# Patient Record
Sex: Female | Born: 1985 | Race: Black or African American | Hispanic: No | Marital: Single | Smoking: Never smoker
Health system: Southern US, Community
[De-identification: ages and names within clinical notes are randomized; demographics above are authoritative.]

## PROBLEM LIST (undated history)

## (undated) DIAGNOSIS — N841 Polyp of cervix uteri: Secondary | ICD-10-CM

## (undated) DIAGNOSIS — K219 Gastro-esophageal reflux disease without esophagitis: Secondary | ICD-10-CM

## (undated) HISTORY — DX: Gastro-esophageal reflux disease without esophagitis: K21.9

## (undated) HISTORY — PX: CHOLECYSTECTOMY: SHX55

## (undated) HISTORY — DX: Polyp of cervix uteri: N84.1

---

## 2005-12-28 ENCOUNTER — Emergency Department (HOSPITAL_COMMUNITY): Admission: EM | Admit: 2005-12-28 | Discharge: 2005-12-28 | Payer: Self-pay | Admitting: Emergency Medicine

## 2006-12-30 ENCOUNTER — Inpatient Hospital Stay (HOSPITAL_COMMUNITY): Admission: AD | Admit: 2006-12-30 | Discharge: 2006-12-30 | Payer: Self-pay | Admitting: Obstetrics & Gynecology

## 2007-01-03 ENCOUNTER — Inpatient Hospital Stay (HOSPITAL_COMMUNITY): Admission: AD | Admit: 2007-01-03 | Discharge: 2007-01-03 | Payer: Self-pay | Admitting: Obstetrics & Gynecology

## 2007-01-06 ENCOUNTER — Inpatient Hospital Stay (HOSPITAL_COMMUNITY): Admission: AD | Admit: 2007-01-06 | Discharge: 2007-01-06 | Payer: Self-pay | Admitting: Obstetrics and Gynecology

## 2007-01-13 ENCOUNTER — Inpatient Hospital Stay (HOSPITAL_COMMUNITY): Admission: RE | Admit: 2007-01-13 | Discharge: 2007-01-13 | Payer: Self-pay | Admitting: Gynecology

## 2007-01-20 ENCOUNTER — Inpatient Hospital Stay (HOSPITAL_COMMUNITY): Admission: AD | Admit: 2007-01-20 | Discharge: 2007-01-20 | Payer: Self-pay | Admitting: Obstetrics and Gynecology

## 2007-01-28 ENCOUNTER — Inpatient Hospital Stay (HOSPITAL_COMMUNITY): Admission: AD | Admit: 2007-01-28 | Discharge: 2007-01-28 | Payer: Self-pay | Admitting: Obstetrics and Gynecology

## 2007-02-04 ENCOUNTER — Inpatient Hospital Stay (HOSPITAL_COMMUNITY): Admission: RE | Admit: 2007-02-04 | Discharge: 2007-02-04 | Payer: Self-pay | Admitting: Obstetrics & Gynecology

## 2007-06-24 ENCOUNTER — Emergency Department: Payer: Self-pay | Admitting: Emergency Medicine

## 2008-08-23 ENCOUNTER — Emergency Department (HOSPITAL_COMMUNITY): Admission: EM | Admit: 2008-08-23 | Discharge: 2008-08-24 | Payer: Self-pay | Admitting: Emergency Medicine

## 2008-10-25 ENCOUNTER — Inpatient Hospital Stay (HOSPITAL_COMMUNITY): Admission: AD | Admit: 2008-10-25 | Discharge: 2008-10-25 | Payer: Self-pay | Admitting: Obstetrics & Gynecology

## 2009-03-13 IMAGING — US US OB TRANSVAGINAL MODIFY
1 series · 14 of 28 positions shown · non-contrast
Comparison: None.

CLINICAL DATA: Pelvic and pain and vaginal bleeding. Four weeks and 4 days
pregnant by last menstrual period. Quantitative beta-HCG pending.

COMPLETE OBSTETRICAL ULTRASOUND LESS THAN 14 WEEKS AND TRANSVAGINAL OBSTETRICAL
ULTRASOUND

[Series 1: us ob comp less 14 wks · 14 of 43 slices shown]
[im 2/43]
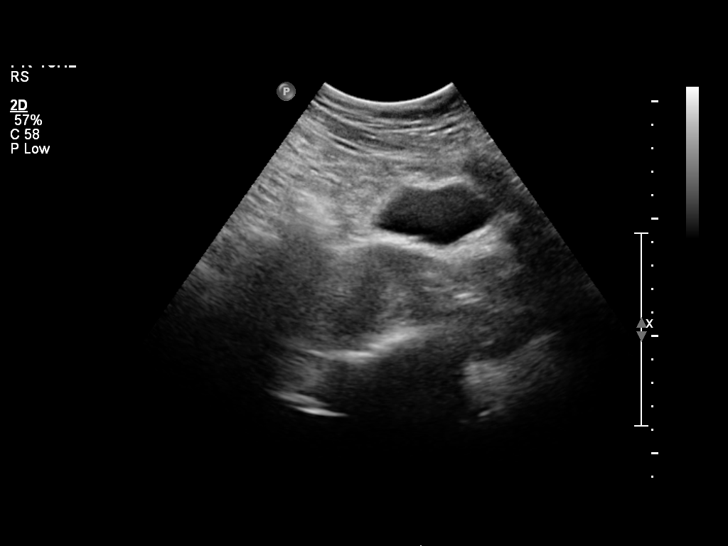
[im 5/43]
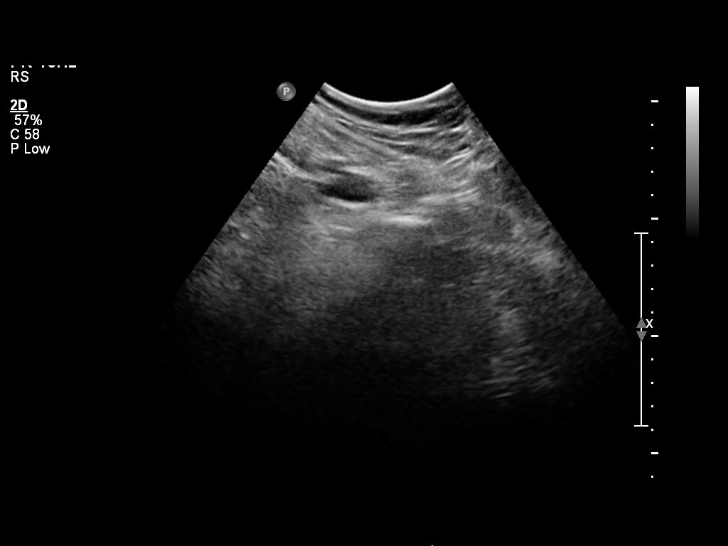
[im 8/43]
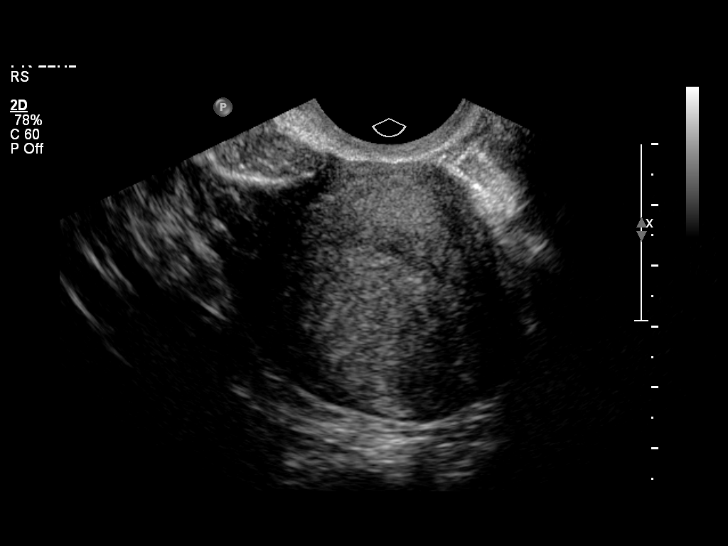
[im 11/43]
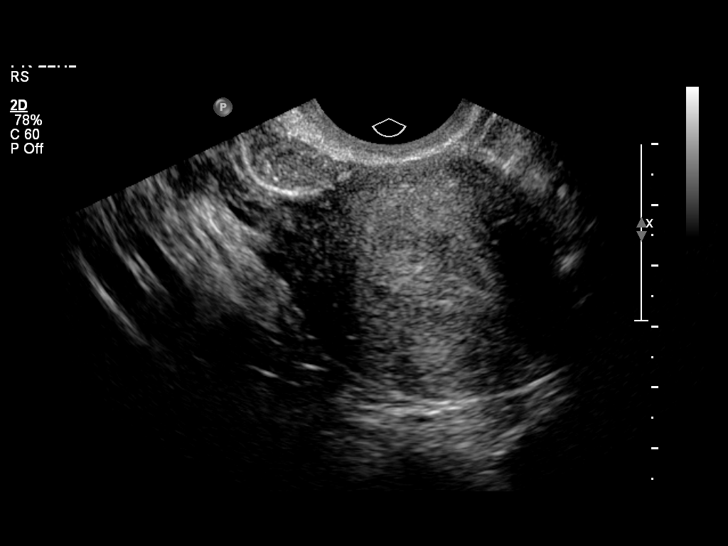
[im 15/43]
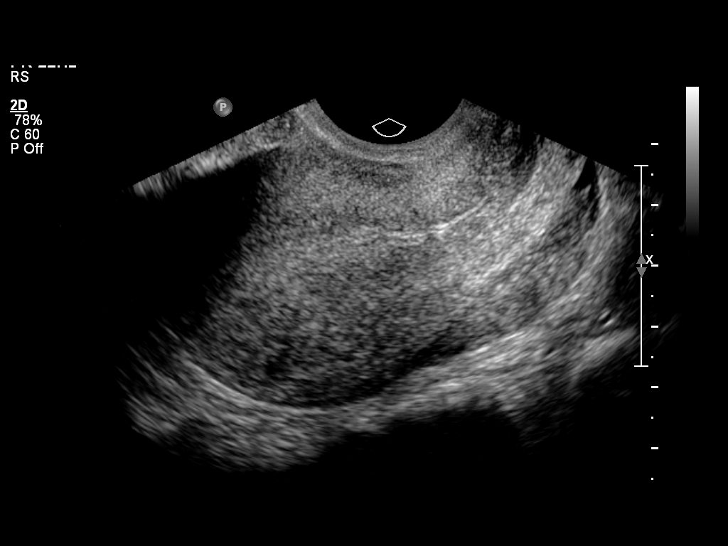
[im 18/43]
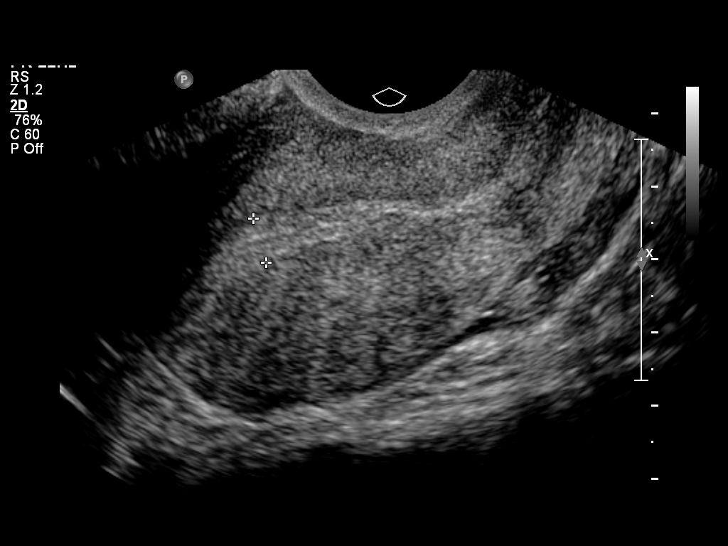
[im 21/43]
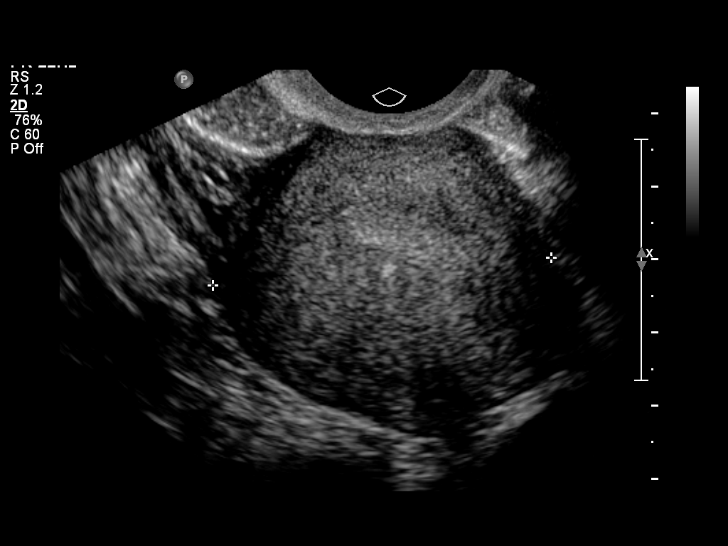
[im 24/43]
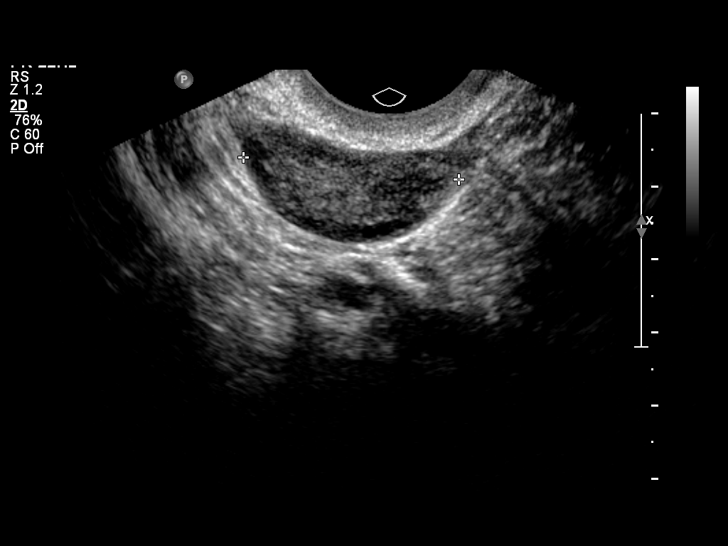
[im 27/43]
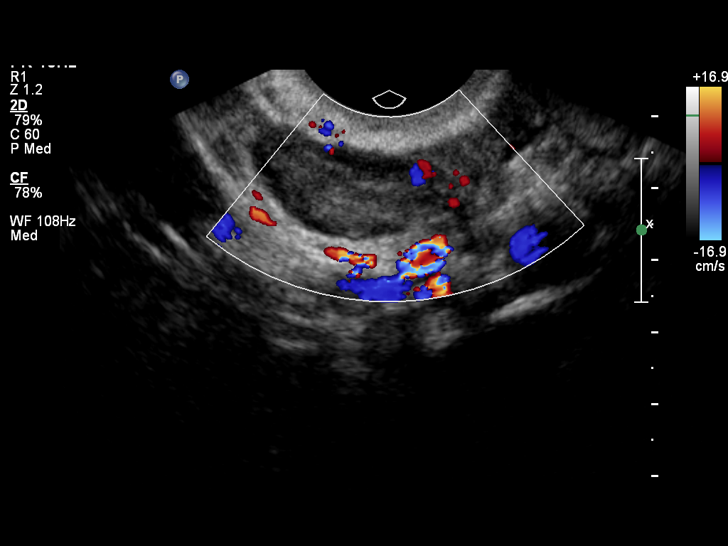
[im 30/43]
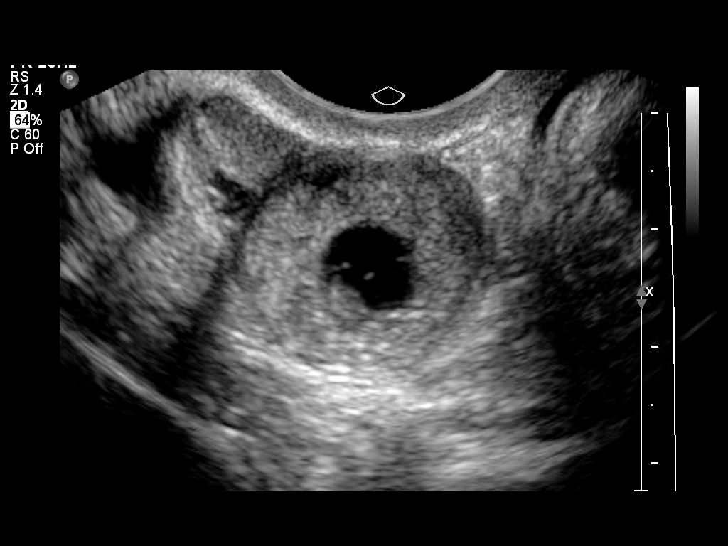
[im 33/43]
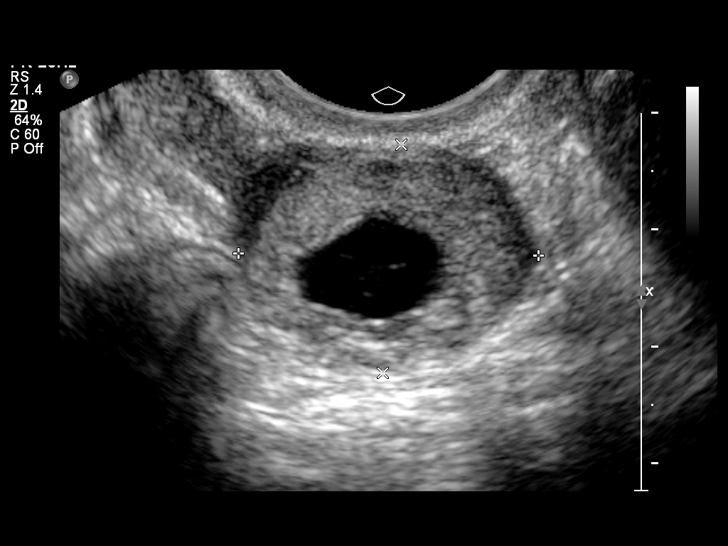
[im 36/43]
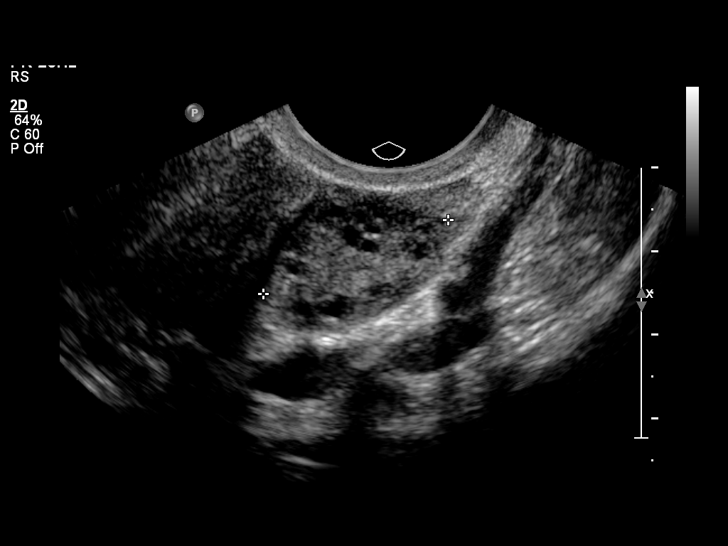
[im 39/43]
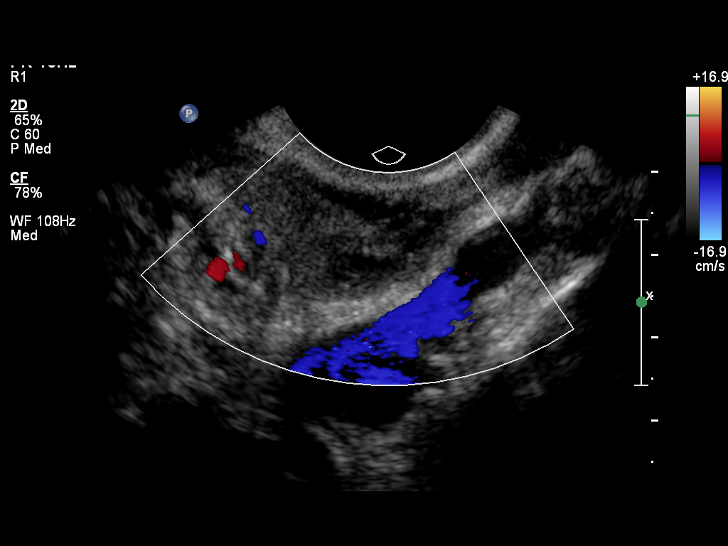
[im 43/43]
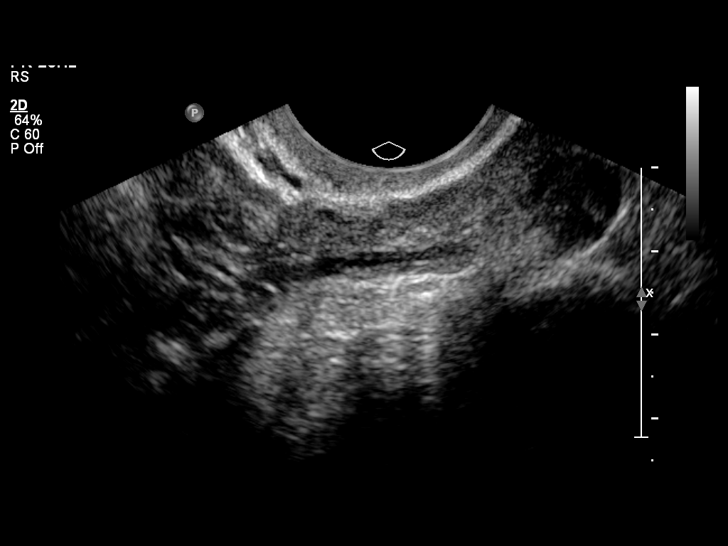

[14 of 28 positions shown; findings below may reference images not displayed]

FINDINGS: Transabdominal and transvaginal sonographic imaging of the pelvis
demonstrates a normal appearing uterus with a normal appearing endometrial
stripe, measuring 6.3 mm in maximum thickness transvaginally. 2.6 x 2.1 x 2.0 cm
right adnexal mass with an appearance compatible with an ectopic pregnancy.
Within the gestational sac, thin linear membranes are demonstrated with no
visible fetal pole or yolk sac. Normal appearing ovaries. No free peritoneal
fluid.

IMPRESSION

Right ectopic pregnancy without rupture or visible fetal pole, most likely
representing fetal demise.

## 2009-04-28 ENCOUNTER — Inpatient Hospital Stay (HOSPITAL_COMMUNITY): Admission: AD | Admit: 2009-04-28 | Discharge: 2009-04-28 | Payer: Self-pay | Admitting: Obstetrics and Gynecology

## 2010-05-14 LAB — POCT PREGNANCY, URINE: Preg Test, Ur: NEGATIVE

## 2010-05-24 ENCOUNTER — Emergency Department: Payer: Self-pay | Admitting: Emergency Medicine

## 2010-05-26 LAB — URINE CULTURE: Colony Count: 8000

## 2010-05-26 LAB — URINALYSIS, ROUTINE W REFLEX MICROSCOPIC
Bilirubin Urine: NEGATIVE
Glucose, UA: NEGATIVE mg/dL
Ketones, ur: NEGATIVE mg/dL
Protein, ur: NEGATIVE mg/dL
Urobilinogen, UA: 0.2 mg/dL (ref 0.0–1.0)
pH: 6 (ref 5.0–8.0)

## 2010-05-26 LAB — GC/CHLAMYDIA PROBE AMP, GENITAL: Chlamydia, DNA Probe: NEGATIVE

## 2010-05-26 LAB — URINE MICROSCOPIC-ADD ON

## 2010-05-26 LAB — CBC
HCT: 35.4 % — ABNORMAL LOW (ref 36.0–46.0)
MCHC: 32.5 g/dL (ref 30.0–36.0)
MCV: 84.3 fL (ref 78.0–100.0)
WBC: 6.3 10*3/uL (ref 4.0–10.5)

## 2010-05-26 LAB — WET PREP, GENITAL

## 2010-05-26 LAB — POCT PREGNANCY, URINE: Preg Test, Ur: NEGATIVE

## 2010-09-20 ENCOUNTER — Emergency Department: Payer: Self-pay | Admitting: Emergency Medicine

## 2010-11-24 LAB — HCG, QUANTITATIVE, PREGNANCY: hCG, Beta Chain, Quant, S: 14 — ABNORMAL HIGH

## 2010-11-28 LAB — AST: AST: 21

## 2010-11-28 LAB — CBC
HCT: 36.3
Hemoglobin: 11.9 — ABNORMAL LOW
MCHC: 32.9
MCV: 81.8
RDW: 18.5 — ABNORMAL HIGH

## 2010-11-28 LAB — WET PREP, GENITAL
Trich, Wet Prep: NONE SEEN
Yeast Wet Prep HPF POC: NONE SEEN

## 2010-11-28 LAB — DIFFERENTIAL
Basophils Absolute: 0.1
Basophils Relative: 1
Eosinophils Relative: 6 — ABNORMAL HIGH
Lymphs Abs: 3
Neutrophils Relative %: 51

## 2010-11-28 LAB — HCG, QUANTITATIVE, PREGNANCY
hCG, Beta Chain, Quant, S: 2194 — ABNORMAL HIGH
hCG, Beta Chain, Quant, S: 4291 — ABNORMAL HIGH

## 2010-11-28 LAB — GC/CHLAMYDIA PROBE AMP, GENITAL
Chlamydia, DNA Probe: NEGATIVE
GC Probe Amp, Genital: NEGATIVE

## 2012-11-10 ENCOUNTER — Emergency Department: Payer: Self-pay | Admitting: Emergency Medicine

## 2013-06-17 ENCOUNTER — Emergency Department: Payer: Self-pay | Admitting: Emergency Medicine

## 2016-03-28 DIAGNOSIS — N841 Polyp of cervix uteri: Secondary | ICD-10-CM

## 2016-03-28 HISTORY — DX: Polyp of cervix uteri: N84.1

## 2016-03-31 LAB — HM PAP SMEAR: HM Pap smear: NEGATIVE

## 2016-05-06 ENCOUNTER — Emergency Department
Admission: EM | Admit: 2016-05-06 | Discharge: 2016-05-06 | Disposition: A | Discharge status: Home / Self Care | Payer: Self-pay | Attending: Emergency Medicine | Admitting: Emergency Medicine

## 2016-05-06 ENCOUNTER — Emergency Department: Payer: Self-pay

## 2016-05-06 ENCOUNTER — Encounter: Payer: Self-pay | Admitting: Emergency Medicine

## 2016-05-06 DIAGNOSIS — R1013 Epigastric pain: Secondary | ICD-10-CM | POA: Insufficient documentation

## 2016-05-06 LAB — HEPATIC FUNCTION PANEL
ALK PHOS: 44 U/L (ref 38–126)
ALT: 54 U/L (ref 14–54)
AST: 32 U/L (ref 15–41)
Albumin: 3.9 g/dL (ref 3.5–5.0)
BILIRUBIN INDIRECT: 0.3 mg/dL (ref 0.3–0.9)
BILIRUBIN TOTAL: 0.4 mg/dL (ref 0.3–1.2)
Bilirubin, Direct: 0.1 mg/dL (ref 0.1–0.5)
TOTAL PROTEIN: 8.2 g/dL — AB (ref 6.5–8.1)

## 2016-05-06 LAB — CBC
HEMATOCRIT: 36.9 % (ref 35.0–47.0)
HEMOGLOBIN: 12.6 g/dL (ref 12.0–16.0)
MCH: 29.5 pg (ref 26.0–34.0)
MCHC: 34.1 g/dL (ref 32.0–36.0)
MCV: 86.5 fL (ref 80.0–100.0)
Platelets: 285 10*3/uL (ref 150–440)
RBC: 4.26 MIL/uL (ref 3.80–5.20)
RDW: 14.3 % (ref 11.5–14.5)
WBC: 10.4 10*3/uL (ref 3.6–11.0)

## 2016-05-06 LAB — BASIC METABOLIC PANEL
Anion gap: 5 (ref 5–15)
BUN: 9 mg/dL (ref 6–20)
CALCIUM: 9.4 mg/dL (ref 8.9–10.3)
CO2: 23 mmol/L (ref 22–32)
Chloride: 106 mmol/L (ref 101–111)
Creatinine, Ser: 0.54 mg/dL (ref 0.44–1.00)
GFR calc Af Amer: >60 mL/min (ref >60)
GLUCOSE: 104 mg/dL — AB (ref 65–99)
Potassium: 3.8 mmol/L (ref 3.5–5.1)
Sodium: 134 mmol/L — ABNORMAL LOW (ref 135–145)

## 2016-05-06 LAB — TROPONIN I: Troponin I: <0.03 ng/mL (ref <0.03)

## 2016-05-06 LAB — POCT PREGNANCY, URINE: Preg Test, Ur: NEGATIVE

## 2016-05-06 LAB — LIPASE, BLOOD: LIPASE: 22 U/L (ref 11–51)

## 2016-05-06 MED ORDER — GI COCKTAIL ~~LOC~~
30.0000 mL | Freq: Once | ORAL | Status: AC
  Administered 2016-05-06: 30 mL via ORAL

## 2016-05-06 MED ORDER — MORPHINE SULFATE (PF) 4 MG/ML IV SOLN
4.0000 mg | Freq: Once | INTRAVENOUS | Status: AC
  Administered 2016-05-06: 4 mg via INTRAMUSCULAR
  Filled 2016-05-06: qty 1

## 2016-05-06 MED ORDER — ONDANSETRON 4 MG PO TBDP
ORAL_TABLET | ORAL | Status: AC
  Filled 2016-05-06: qty 1

## 2016-05-06 MED ORDER — HYDROCODONE-ACETAMINOPHEN 5-325 MG PO TABS: 1.0000 | ORAL_TABLET | ORAL | 0 refills | Status: DC | PRN

## 2016-05-06 MED ORDER — GI COCKTAIL ~~LOC~~
ORAL | Status: AC
  Filled 2016-05-06: qty 30

## 2016-05-06 MED ORDER — ONDANSETRON 4 MG PO TBDP: 4.0000 mg | ORAL_TABLET | Freq: Three times a day (TID) | ORAL | 0 refills | Status: DC | PRN

## 2016-05-06 MED ORDER — PROMETHAZINE HCL 25 MG PO TABS
25.0000 mg | ORAL_TABLET | Freq: Once | ORAL | Status: AC
  Administered 2016-05-06: 25 mg via ORAL
  Filled 2016-05-06: qty 1

## 2016-05-06 MED ORDER — ONDANSETRON 4 MG PO TBDP
4.0000 mg | ORAL_TABLET | Freq: Once | ORAL | Status: AC
  Administered 2016-05-06: 4 mg via ORAL

## 2016-05-06 NOTE — ED Provider Notes (Signed)
[facility] Emergency Department Provider Note  Time seen: 2:18 PM  I have reviewed the triage vital signs and the nursing notes.   HISTORY  Chief Complaint Chest Pain    HPI Karen Campbell is a 31 y.o. female with a past medical history of gastric reflux, presents the emergency department for chest/upper abdominal discomfort. According to the patient she was awoken from her sleep around 7:00 this morning with epigastric/central lower chest discomfort. She states it felt like indigestion she took Gas-X without relief. States continues to feel discomfort, some nausea and said 2 episodes of vomiting. Denies any diarrhea. Denies any shortness of breath, or diaphoresis.  History reviewed. No pertinent past medical history.  There are no active problems to display for this patient.   History reviewed. No pertinent surgical history.  Prior to Admission medications   Not on File    Not on File  No family history on file.  Social History Social History  Substance Use Topics  . Smoking status: Never Smoker  . Smokeless tobacco: Never Used  . Alcohol use Yes    Review of Systems Constitutional: Negative for fever. Cardiovascular: Negative for chest pain. Respiratory: Negative for shortness of breath. Gastrointestinal: Mild epigastric/lower chest discomfort. Positive nausea and vomiting. Negative diarrhea. Genitourinary: Negative for dysuria. Musculoskeletal: Negative for back pain Neurological: Negative for headache 10-point ROS otherwise negative.  ____________________________________________   PHYSICAL EXAM:  VITAL SIGNS: ED Triage Vitals  Enc Vitals Group     BP 05/06/16 1327 123/72     Pulse Rate 05/06/16 1327 (!) 55     Resp 05/06/16 1327 20     Temp 05/06/16 1327 98.9 F (37.2 C)     Temp Source 05/06/16 1327 Oral     SpO2 05/06/16 1327 99 %     Weight 05/06/16 1327 265 lb (120.2 kg)     Height 05/06/16 1327 5\' 6"  (1.676 m)   Head Circumference --      Peak Flow --      Pain Score 05/06/16 1335 10     Pain Loc --      Pain Edu? --      Excl. in GC? --     Constitutional: Alert and oriented. Well appearing and in no distress. Eyes: Normal exam ENT   Head: Normocephalic and atraumatic   Mouth/Throat: Mucous membranes are moist. Cardiovascular: Normal rate, regular rhythm. No murmur Respiratory: Normal respiratory effort without tachypnea nor retractions. Breath sounds are clear Gastrointestinal: Soft, mild epigastric tenderness to palpation. No rebound or guarding. No distention. No CVA tenderness. Musculoskeletal: Nontender with normal range of motion in all extremities.  Neurologic:  Normal speech and language. No gross focal neurologic deficits Skin:  Skin is warm, dry and intact.  Psychiatric: Mood and affect are normal. Speech and behavior are normal.   ____________________________________________    EKG  EKG reviewed and interpreted by myself as sinus bradycardia at 53 bpm, narrow QRS, normal axis, normal intervals, no ST changes. Normal EKG.  ____________________________________________    RADIOLOGY  Chest x-ray negative  ____________________________________________   INITIAL IMPRESSION / ASSESSMENT AND PLAN / ED COURSE  Pertinent labs & imaging results that were available during my care of the patient were reviewed by me and considered in my medical decision making (see chart for details).  Patient presents to the emergency department with central lower chest/epigastric discomfort since 7:00 this morning. We will check labs including cardiac enzymes, LFTs, lipase. EKG is reassuring. We will  obtain a chest x-ray and closely monitor in the emergency department. We will dose a GI cocktail as well as an ODT Zofran.  Patient's labs have resulted normal. Troponin is negative. Lipase is normal. Liver function tests are normal. No white blood cell count elevation. Patient continues to have  epigastric discomfort. States no improvement with GI cocktail. We'll dose Phenergan IM morphine. I discussed with the patient obtaining a CT scan to further evaluate, patient wishes to hold off for now to see how the medications work.  The patient's labs are normal. I once again offered CT scan. She states her pain is much improved after medication. We'll discharge with a short course of pain medication and nausea medication. I discussed return precautions with the patient for any increased abdominal pain fever or vomiting unable to keep down fluids. Otherwise the patient states she will give this a trial of 24-48 hours and if not improved she will also return to the ER otherwise she will follow up with primary care doctor.  ____________________________________________   FINAL CLINICAL IMPRESSION(S) / ED DIAGNOSES  epigastric pain    Minna AntisKevin Tashon Capp, MD 05/06/16 [phone]

## 2016-05-06 NOTE — ED Triage Notes (Signed)
Pt reports chest pain woke up her from sleep reports continues with mid chest pain, pt has had episodes of emesis, pt had one episode of emesis while in triage. Pt talks in complete sentences no distress noted

## 2016-05-14 DIAGNOSIS — K8050 Calculus of bile duct without cholangitis or cholecystitis without obstruction: Secondary | ICD-10-CM | POA: Insufficient documentation

## 2016-06-26 DIAGNOSIS — N841 Polyp of cervix uteri: Secondary | ICD-10-CM | POA: Insufficient documentation

## 2017-03-24 ENCOUNTER — Emergency Department
Admission: EM | Admit: 2017-03-24 | Discharge: 2017-03-24 | Disposition: A | Discharge status: Home / Self Care | Payer: Self-pay | Attending: Emergency Medicine | Admitting: Emergency Medicine

## 2017-03-24 ENCOUNTER — Encounter: Payer: Self-pay | Admitting: Emergency Medicine

## 2017-03-24 ENCOUNTER — Other Ambulatory Visit: Payer: Self-pay

## 2017-03-24 DIAGNOSIS — N3090 Cystitis, unspecified without hematuria: Secondary | ICD-10-CM | POA: Insufficient documentation

## 2017-03-24 DIAGNOSIS — J0100 Acute maxillary sinusitis, unspecified: Secondary | ICD-10-CM | POA: Insufficient documentation

## 2017-03-24 DIAGNOSIS — Z79899 Other long term (current) drug therapy: Secondary | ICD-10-CM | POA: Insufficient documentation

## 2017-03-24 LAB — URINALYSIS, COMPLETE (UACMP) WITH MICROSCOPIC
BACTERIA UA: NONE SEEN
Bilirubin Urine: NEGATIVE
GLUCOSE, UA: NEGATIVE mg/dL
Ketones, ur: NEGATIVE mg/dL
Nitrite: NEGATIVE
PROTEIN: 30 mg/dL — AB
Specific Gravity, Urine: 1.021 (ref 1.005–1.030)
pH: 6 (ref 5.0–8.0)

## 2017-03-24 MED ORDER — FLUTICASONE PROPIONATE 50 MCG/ACT NA SUSP: 2.0000 | Freq: Every day | NASAL | 0 refills | Status: DC

## 2017-03-24 MED ORDER — CEPHALEXIN 500 MG PO CAPS: 500.0000 mg | ORAL_CAPSULE | Freq: Two times a day (BID) | ORAL | 0 refills | Status: AC

## 2017-03-24 NOTE — ED Provider Notes (Signed)
[facility] Emergency Department Provider Note  ____________________________________________  Time seen: Approximately 9:50 AM  I have reviewed the triage vital signs and the nursing notes.   HISTORY  Chief Complaint Otalgia and Urinary Frequency    HPI Karen Campbell is a 32 y.o. female presents to emergency department for evaluation of left ear pain and nasal congestion for 1 day and dysuria and urinary frequency for 3 days.  Patient states that this feels similar to urinary tract infections that she has had in the past.  She has never had a kidney stone. She is about to start her menstrual cycle. No nausea, vomiting, abdominal pain, back pain, hematuria.    History reviewed. No pertinent past medical history.  There are no active problems to display for this patient.   History reviewed. No pertinent surgical history.  Prior to Admission medications   Medication Sig Start Date End Date Taking? Authorizing Provider  cephALEXin (KEFLEX) 500 MG capsule Take 1 capsule (500 mg total) by mouth 2 (two) times daily for 10 days. 03/24/17 04/03/17  Enid DerryWagner, Tunisha Ruland, PA-C  fluticasone (FLONASE) 50 MCG/ACT nasal spray Place 2 sprays into both nostrils daily. 03/24/17 03/24/18  Enid DerryWagner, Nicky Milhouse, PA-C  HYDROcodone-acetaminophen (NORCO/VICODIN) 5-325 MG tablet Take 1 tablet by mouth every 4 (four) hours as needed. 05/06/16   Minna AntisPaduchowski, Kevin, MD  ondansetron (ZOFRAN ODT) 4 MG disintegrating tablet Take 1 tablet (4 mg total) by mouth every 8 (eight) hours as needed for nausea or vomiting. 05/06/16   Minna AntisPaduchowski, Kevin, MD    Allergies Patient has no known allergies.  History reviewed. No pertinent family history.  Social History Social History   Tobacco Use  . Smoking status: Never Smoker  . Smokeless tobacco: Never Used  Substance Use Topics  . Alcohol use: No    Frequency: Never  . Drug use: No     Review of Systems  Constitutional: No fever/chills ENT:  Positive for congestion and rhinorrhea. Cardiovascular: No chest pain. Respiratory: Negative for cough. No SOB. Gastrointestinal: No abdominal pain.  No nausea, no vomiting.  No diarrhea.  No constipation. Musculoskeletal: Negative for musculoskeletal pain. Skin: Negative for rash, abrasions, lacerations, ecchymosis. Neurological: Negative for headaches.   ____________________________________________   PHYSICAL EXAM:  VITAL SIGNS: ED Triage Vitals [03/24/17 0733]  Enc Vitals Group     BP 120/79     Pulse Rate 61     Resp 17     Temp 98.2 F (36.8 C)     Temp Source Oral     SpO2 98 %     Weight 265 lb (120.2 kg)     Height 5\' 6"  (1.676 m)     Head Circumference      Peak Flow      Pain Score 6     Pain Loc      Pain Edu?      Excl. in GC?      Constitutional: Alert and oriented. Well appearing and in no acute distress. Eyes: Conjunctivae are normal. PERRL. EOMI. No discharge. Head: Atraumatic. ENT: No frontal and maxillary sinus tenderness.      Ears: Tympanic membranes pearly gray with good landmarks. No discharge.      Nose: Mild congestion/rhinnorhea.      Mouth/Throat: Mucous membranes are moist. Oropharynx non-erythematous. Tonsils not enlarged. No exudates. Uvula midline. Neck: No stridor.   Hematological/Lymphatic/Immunilogical: No cervical lymphadenopathy. Cardiovascular: Normal rate, regular rhythm.  Good peripheral circulation. Respiratory: Normal respiratory effort without tachypnea or retractions.  Lungs CTAB. Good air entry to the bases with no decreased or absent breath sounds. Gastrointestinal: Bowel sounds 4 quadrants. Soft and nontender to palpation. No guarding or rigidity. No palpable masses. No distention. No CVA tenderness. Musculoskeletal: Full range of motion to all extremities. No gross deformities appreciated. Neurologic:  Normal speech and language. No gross focal neurologic deficits are appreciated.  Skin:  Skin is warm, dry and intact. No  rash noted.   ____________________________________________   LABS (all labs ordered are listed, but only abnormal results are displayed)  Labs Reviewed  URINALYSIS, COMPLETE (UACMP) WITH MICROSCOPIC - Abnormal; Notable for the following components:      Result Value   Color, Urine AMBER (*)    APPearance CLOUDY (*)    Hgb urine dipstick MODERATE (*)    Protein, ur 30 (*)    Leukocytes, UA LARGE (*)    Squamous Epithelial / LPF 6-30 (*)    Non Squamous Epithelial 0-5 (*)    All other components within normal limits   ____________________________________________  EKG   ____________________________________________  RADIOLOGY   No results found.  ____________________________________________    PROCEDURES  Procedure(s) performed:    Procedures    Medications - No data to display   ____________________________________________   INITIAL IMPRESSION / ASSESSMENT AND PLAN / ED COURSE  Pertinent labs & imaging results that were available during my care of the patient were reviewed by me and considered in my medical decision making (see chart for details).  Review of the [place] CSRS was performed in accordance of the [facility] prior to dispensing any controlled drugs.   Patient's diagnosis is consistent with cystitis and sinusitis. Vital signs and exam are reassuring. Urinalysis consistent with infection. Patient is about to start menstrual cycle, which is likely where blood on urinalysis is from. No abdominal or back pain. Patient appears well and is staying well hydrated. Patient should alternate tylenol and ibuprofen for fever. Patient feels comfortable going home. Patient will be discharged home with prescriptions for keflex. Patient is to follow up with PCP as needed or otherwise directed. Patient is given ED precautions to return to the ED for any worsening or new symptoms.     ____________________________________________  FINAL CLINICAL IMPRESSION(S) / ED  DIAGNOSES  Final diagnoses:  Cystitis  Acute non-recurrent maxillary sinusitis      NEW MEDICATIONS STARTED DURING THIS VISIT:  ED Discharge Orders        Ordered    cephALEXin (KEFLEX) 500 MG capsule  2 times daily     03/24/17 0951    fluticasone (FLONASE) 50 MCG/ACT nasal spray  Daily     03/24/17 0951          This chart was dictated using voice recognition software/Dragon. Despite best efforts to proofread, errors can occur which can change the meaning. Any change was purely unintentional.    Enid Derry, PA-C 03/24/17 1055    Minna Antis, MD 03/24/17 [phone]

## 2017-03-24 NOTE — ED Triage Notes (Signed)
Pt presents to ED via POV with c/o L ear pain since yesterday. Pt c/o urinary frequency x several days. Pt states hx of UTI and this is normal symptom for her.

## 2017-03-24 NOTE — ED Notes (Signed)
See triage note  Presents with dysuria and freq for 2 -3 days   Developed ear pain yesterday  No fever

## 2017-11-13 LAB — HM HIV SCREENING LAB: HM HIV SCREENING: NEGATIVE

## 2018-02-25 DIAGNOSIS — L682 Localized hypertrichosis: Secondary | ICD-10-CM | POA: Diagnosis not present

## 2018-02-25 DIAGNOSIS — L819 Disorder of pigmentation, unspecified: Secondary | ICD-10-CM | POA: Diagnosis not present

## 2018-02-25 DIAGNOSIS — L731 Pseudofolliculitis barbae: Secondary | ICD-10-CM | POA: Diagnosis not present

## 2018-07-17 DIAGNOSIS — H02823 Cysts of right eye, unspecified eyelid: Secondary | ICD-10-CM | POA: Diagnosis not present

## 2018-11-24 DIAGNOSIS — Z23 Encounter for immunization: Secondary | ICD-10-CM | POA: Diagnosis not present

## 2019-01-10 DIAGNOSIS — Z20828 Contact with and (suspected) exposure to other viral communicable diseases: Secondary | ICD-10-CM | POA: Diagnosis not present

## 2019-04-01 DIAGNOSIS — H02823 Cysts of right eye, unspecified eyelid: Secondary | ICD-10-CM | POA: Diagnosis not present

## 2019-04-10 DIAGNOSIS — D485 Neoplasm of uncertain behavior of skin: Secondary | ICD-10-CM | POA: Diagnosis not present

## 2019-05-20 ENCOUNTER — Other Ambulatory Visit: Payer: Self-pay

## 2019-05-20 ENCOUNTER — Other Ambulatory Visit (HOSPITAL_COMMUNITY)
Admission: RE | Admit: 2019-05-20 | Discharge: 2019-05-20 | Disposition: A | Discharge status: Home / Self Care | Payer: BLUE CROSS/BLUE SHIELD | Source: Ambulatory Visit | Attending: Obstetrics & Gynecology | Admitting: Obstetrics & Gynecology

## 2019-05-20 ENCOUNTER — Telehealth: Payer: Self-pay | Admitting: Obstetrics & Gynecology

## 2019-05-20 ENCOUNTER — Ambulatory Visit: Payer: BLUE CROSS/BLUE SHIELD | Admitting: Obstetrics & Gynecology

## 2019-05-20 ENCOUNTER — Encounter: Payer: Self-pay | Admitting: Obstetrics & Gynecology

## 2019-05-20 VITALS — BP 120/70 | Ht 66.0 in | Wt 276.0 lb

## 2019-05-20 DIAGNOSIS — N970 Female infertility associated with anovulation: Secondary | ICD-10-CM | POA: Diagnosis not present

## 2019-05-20 DIAGNOSIS — Z124 Encounter for screening for malignant neoplasm of cervix: Secondary | ICD-10-CM | POA: Diagnosis not present

## 2019-05-20 NOTE — Progress Notes (Signed)
Gynecology Infertility Exam  PCP: Patient, No Pcp Per  Chief Complaint: Infertility  History of Present Illness: Patient is a 34 y.o. G1P0010 presenting for evaluation of infertility. Patient and partner have been attempting conception for 2 years. Marital Status: married for 5 years. Pregnancies with current partner no Husband has 100 yo child w previous relationship  Menstrual and Endocrine History LMP: Patient's last menstrual period was 05/02/2019. Menarche:11 Shortest Interval: 25 Longest Interval: 30  days Duration of flow: 7 days Heavy Menses: no Clots: no Intermenstrual Bleeding: no Postcoital Bleeding: no Dysmenorrhea: yes Amenorrhea: not applicable Wt Change: obesity most of life; recent 17 lb weight loss Hirsutism: no Balding: no Acne: no Galactorrhea: no  Obstetrical History ECTOPIC 2011 treated w MTX  Gynecologic History Last PAP: 2018 nml Previous abdominal or pelvic surgery: no Pelvic Pain:  no Endometriosis: no Hot Flashes: no DES Exposure: no Abnormal Pap: no Cervix Cryo/cone: no STD: no PID: no  Infertility and Endocrine Studies BBT: no Endo. Bx.:no HSG: no PCT: no Laparoscopy: no Hormonal Studies: no Semen analysis: no Other Studies: no Meds: none Other Therapies: Not applicable Insemination:not applicable  Sexual History Frequency: a few times per week(s) Satisfied: yes Dyspareunia: no Use of Lubricant: no Douching: no  Contraception None  Has not used hormonal BC for >5 years  Family History Thyroid Problems: no Heart Condition or High Blood Pressure: no Blood Clot or Stroke: no Diabetes: yes Cancer: no Birth DefectsInherited diseases:no Infectious diseases (mumps, TB, Rubella):no Other Medical Problems: no MR/autism/fragile X or POF: none  Habits Cigarettes:    Wife -  no    Husband - unk  PMHx: She  has a past medical history of Cervical polyp (03/28/2016). Also,  has no past surgical history on file., family  history is not on file.,  reports that she has never smoked. She has never used smokeless tobacco. She reports that she does not drink alcohol or use drugs.  She has a current medication list which includes the following prescription(s): fluticasone, hydrocodone-acetaminophen, and ondansetron. Also, has No Known Allergies.  Review of Systems  Constitutional: Negative for chills, fever and malaise/fatigue.  HENT: Negative for congestion, sinus pain and sore throat.   Eyes: Negative for blurred vision and pain.  Respiratory: Negative for cough and wheezing.   Cardiovascular: Negative for chest pain and leg swelling.  Gastrointestinal: Negative for abdominal pain, constipation, diarrhea, heartburn, nausea and vomiting.  Genitourinary: Negative for dysuria, frequency, hematuria and urgency.  Musculoskeletal: Negative for back pain, joint pain, myalgias and neck pain.  Skin: Negative for itching and rash.  Neurological: Negative for dizziness, tremors and weakness.  Endo/Heme/Allergies: Does not bruise/bleed easily.  Psychiatric/Behavioral: Negative for depression. The patient is not nervous/anxious and does not have insomnia.     Objective: BP 120/70   Ht 5\' 6"  (1.676 m)   Wt 276 lb (125.2 kg)   LMP 05/02/2019   BMI 44.55 kg/m  Physical Exam Constitutional:      General: She is not in acute distress.    Appearance: She is well-developed. She is obese.  Genitourinary:     Pelvic exam was performed with patient supine.     Vagina, uterus and rectum normal.     No lesions in the vagina.     No vaginal bleeding.     No cervical motion tenderness, friability, lesion or polyp.     Uterus is mobile.     Uterus is not enlarged.     No uterine mass detected.  Uterus is midaxial.     No right or left adnexal mass present.     Right adnexa not tender.     Left adnexa not tender.  HENT:     Head: Normocephalic and atraumatic. No laceration.     Right Ear: Hearing normal.     Left Ear:  Hearing normal.     Mouth/Throat:     Pharynx: Uvula midline.  Eyes:     Pupils: Pupils are equal, round, and reactive to light.  Neck:     Thyroid: No thyromegaly.  Cardiovascular:     Rate and Rhythm: Normal rate and regular rhythm.     Heart sounds: No murmur. No friction rub. No gallop.   Pulmonary:     Effort: Pulmonary effort is normal. No respiratory distress.     Breath sounds: Normal breath sounds. No wheezing.  Chest:     Breasts:        Right: No mass, skin change or tenderness.        Left: No mass, skin change or tenderness.  Abdominal:     General: Bowel sounds are normal. There is no distension.     Palpations: Abdomen is soft.     Tenderness: There is no abdominal tenderness. There is no rebound.  Musculoskeletal:        General: Normal range of motion.     Cervical back: Normal range of motion and neck supple.  Neurological:     Mental Status: She is alert and oriented to person, place, and time.     Cranial Nerves: No cranial nerve deficit.  Skin:    General: Skin is warm and dry.  Psychiatric:        Judgment: Judgment normal.  Vitals reviewed.      Assessment: 34 y.o. G1P0010 1. 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 Screening for cervical cancer - Cytology - PAP   Plan: 1) We discussed the underlying etiologies which may be implicated in a couple experiencing difficulty conceiving.  The average couple will conceive within the span of 1 year with unprotected coitus, with a monthly fecundity rate of 20% or 1 in 5.  Even without further work up or intervention the patient and her partner may be successful in conceiving unassisted, although if an underlying  etiology can be identified and addressed fecundity rate may improve.  The work up entails examining for ovulatory function, tubal patency, and ruling out female factor infertility.  These may be looked at concurrently or sequentially.  The downside of sequential work up is that this method may miss issues if more than one compartment is contributing.  She is aware that tubal factor or moderate to severe female factor infertility will require further consultation with a reproductive endocrinologist.  In the case of anovulation, use of Clomid (clomiphen citrate) or Femara (letrazole) were discussed with the understanding the the later is an off-label, but well supported use.  With either of these drugs the risk of multiples increases from the standard population rate of 2% to approximately 10%, with higher order multiples possible but unlikely.  Both drugs may require some time to titrate to the appropriate dosage to ensure consistent ovulation.  Cycles will  be limited to 6 cycles on each drug secondary to decreasing rates of conception after 6 cycles.  In addition should patient be started on ovulation induction with Clomid she was advised to discontinue the drug for any vision changes as this is a rare but potentially permanent side-effect if medication is continued.  We discussed timing of intercourse as well as the use of ovulation predictor kits identify the patient's fertile window each month.     2) Preconception counseling - immunization up to date.  Will check Rubella and Varicella.  The patient denies any family history of conditions which would warrant preconception genetic counseling or testing on her or her partner.  Instructed to start prenatal vitamins while trying to conceive.    A total of 60 minutes were spent face-to-face with the patient as well as preparation, review, communication, and documentation during this encounter.   Annamarie Major, MD, [facility], [facility] 05/20/2019  11:11 AM

## 2019-05-20 NOTE — Telephone Encounter (Signed)
-----   Message from Nadara Mustard, MD sent at 05/20/2019 10:54 AM EDT ----- Regarding: Sch HSG at [facility] w PH too on APRIL 20

## 2019-05-20 NOTE — Telephone Encounter (Signed)
Adv pt of HSG on 4/20 with Dr Philis Pique that she should ar at 1:00

## 2019-05-20 NOTE — Telephone Encounter (Signed)
Patient returning call, same cb.  

## 2019-05-20 NOTE — Telephone Encounter (Signed)
Scheduled HSG for pt on 4/20 w/ Dr Tiburcio Pea  Pt to arv at 1:00pm  L/M for pt to rtn call

## 2019-05-20 NOTE — Patient Instructions (Signed)
Female Infertility Plan HSG Xray April 20 Plan Ultrasound week of April 19 Labs today Semen Analysis Soon   Female infertility refers to a woman's inability to get pregnant (conceive) after a year of having sex regularly (or after 6 months in women over age 35) without using birth control. Infertility can also mean that a woman is not able to carry a pregnancy to full term. Both women and men can have fertility problems. What are the causes? This condition may be caused by:  Problems with reproductive organs. Infertility can result if a woman: ? Has an abnormally short cervix or a cervix that does not remain closed during a pregnancy. ? Has a blockage or scarring in the fallopian tubes. ? Has an abnormally shaped uterus. ? Has uterine fibroids. This is a benign mass of tissue or muscle (tumor) that can develop in the uterus. ? Is not ovulating in a regular way.  Certain medical conditions. These may include: ? Polycystic ovary syndrome (PCOS). This is a hormonal disorder that can cause small cysts to grow on the ovaries. This is the most common cause of infertility in women. ? Endometriosis. This is a condition in which the tissue that lines the uterus (endometrium) grows outside of its normal location. ? Cancer and cancer treatments, such as chemotherapy or radiation. ? Premature ovarian failure. This is when ovaries stop producing eggs and hormones before age 56. ? Sexually transmitted diseases, such as chlamydia or gonorrhea. ? Autoimmune disorders. These are disorders in which the body's defense system (immune system) attacks normal, healthy cells. Infertility can be linked to more than one cause. For some women, the cause of infertility is not known (unexplained infertility). What increases the risk?  Age. A woman's fertility declines with age, especially after her mid-16s.  Being underweight or overweight.  Drinking too much alcohol.  Using drugs such as anabolic steroids,  cocaine, and marijuana.  Exercising excessively.  Being exposed to environmental toxins, such as radiation, pesticides, and certain chemicals. What are the signs or symptoms? The main sign of infertility in women is the inability to get pregnant or carry a pregnancy to full term. How is this diagnosed? This condition may be diagnosed by:  Checking whether you are ovulating each month. The tests may include: ? Blood tests to check hormone levels. ? An ultrasound of the ovaries. ? Taking a small tissue that lines the uterus and checking it under a microscope (endometrial biopsy).  Doing additional tests. This is done if ovulation is normal. Tests may include: ? Hysterosalpingography. This X-ray test can show the shape of the uterus and whether the fallopian tubes are open. ? Laparoscopy. This test uses a lighted tube (laparoscope) to look for problems in the fallopian tubes and other organs. ? Transvaginal ultrasound. This imaging test is used to check for abnormalities in the uterus and ovaries. ? Hysteroscopy. This test uses a lighted tube to check for problems in the cervix and the uterus. To be diagnosed with infertility, both partners will have a physical exam. Both partners will also have an extensive medical and sexual history taken. Additional tests may be done. How is this treated? Treatment depends on the cause of infertility. Most cases of infertility in women are treated with medicine or surgery.  Women may take medicine to: ? Correct ovulation problems. ? Treat other health conditions.  Surgery may be done to: ? Repair damage to the ovaries, fallopian tubes, cervix, or uterus. ? Remove growths from the uterus. ?  Remove scar tissue from the uterus, pelvis, or other organs. Assisted reproductive technology (ART) Assisted reproductive technology (ART) refers to all treatments and procedures that combine eggs and sperm outside the body to try to help a couple conceive. ART is  often combined with fertility drugs to stimulate ovulation. Sometimes ART is done using eggs retrieved from another woman's body (donor eggs) or from previously frozen fertilized eggs (embryos). There are different types of ART. These include:  Intrauterine insemination (IUI). A long, thin tube is used to place sperm directly into a woman's uterus. This procedure: ? Is effective for infertility caused by sperm problems, including low sperm count and low motility. ? Can be used in combination with fertility drugs.  In vitro fertilization (IVF). This is done when a woman's fallopian tubes are blocked or when a man has low sperm count. In this procedure: ? Fertility drugs are used to stimulate the ovaries to produce multiple eggs. ? Once mature, these eggs are removed from the body and combined with the sperm to be fertilized. ? The fertilized eggs are then placed into the woman's uterus. Follow these instructions at home:  Take over-the-counter and prescription medicines only as told by your health care provider.  Do not use any products that contain nicotine or tobacco, such as cigarettes and e-cigarettes. If you need help quitting, ask your health care provider.  If you drink alcohol, limit how much you have to 1 drink a day.  Make dietary changes to lose weight or maintain a healthy weight. Work with your health care provider and a dietitian to set a weight-loss goal that is healthy and reasonable for you.  Seek support from a counselor or support group to talk about your concerns related to infertility. Couples counseling may be helpful for you and your partner.  Practice stress reduction techniques that work well for you, such as regular physical activity, meditation, or deep breathing.  Keep all follow-up visits as told by your health care provider. This is important. Contact a health care provider if you:  Feel that stress is interfering with your life and relationships.  Have side  effects from treatments for infertility. Summary  Female infertility refers to a woman's inability to get pregnant (conceive) after a year of having sex regularly (or after 6 months in women over age 75) without using birth control.  To be diagnosed with infertility, both partners will have a physical exam. Both partners will also have an extensive medical and sexual history taken.  Seek support from a counselor or support group to talk about your concerns related to infertility. Couples counseling may be helpful for you and your partner. This information is not intended to replace advice given to you by your health care provider. Make sure you discuss any questions you have with your health care provider. Document Revised: 05/29/2018 Document Reviewed: 01/07/2017 Elsevier Patient Education  2020 [employer].

## 2019-05-22 LAB — TSH: TSH: 0.797 u[IU]/mL (ref 0.450–4.500)

## 2019-05-22 LAB — FSH/LH
FSH: 4 m[IU]/mL
LH: 2.7 m[IU]/mL

## 2019-05-22 LAB — ABO

## 2019-05-22 LAB — HEMOGLOBIN A1C
Est. average glucose Bld gHb Est-mCnc: 114 mg/dL
Hgb A1c MFr Bld: 5.6 % (ref 4.8–5.6)

## 2019-05-22 LAB — TESTOSTERONE, FREE, TOTAL, SHBG
Sex Hormone Binding: 15.8 nmol/L — ABNORMAL LOW (ref 24.6–122.0)
Testosterone, Free: 2.2 pg/mL (ref 0.0–4.2)
Testosterone: 28 ng/dL (ref 8–48)

## 2019-05-22 LAB — ESTRADIOL: Estradiol: 11.8 pg/mL

## 2019-05-22 LAB — VARICELLA ZOSTER ANTIBODY, IGG: Varicella zoster IgG: 519 index (ref >165)

## 2019-05-22 LAB — PROLACTIN: Prolactin: 1192 ng/mL — ABNORMAL HIGH (ref 4.8–23.3)

## 2019-05-22 LAB — RUBELLA SCREEN: Rubella Antibodies, IGG: 2.05 index (ref >0.99)

## 2019-05-26 ENCOUNTER — Other Ambulatory Visit: Payer: Self-pay | Admitting: Obstetrics & Gynecology

## 2019-05-26 DIAGNOSIS — N970 Female infertility associated with anovulation: Secondary | ICD-10-CM

## 2019-05-26 NOTE — Progress Notes (Signed)
Labs normal but needs Prolactin lab repeated FASTING for accuracy and to ensure no problem there.  Plz schedule and let her know.

## 2019-05-26 NOTE — Progress Notes (Signed)
Left message for pt to return call.

## 2019-05-29 NOTE — Progress Notes (Signed)
Ca n you schedule this please?

## 2019-06-02 ENCOUNTER — Other Ambulatory Visit: Payer: Self-pay

## 2019-06-02 ENCOUNTER — Other Ambulatory Visit: Payer: BLUE CROSS/BLUE SHIELD

## 2019-06-02 DIAGNOSIS — N970 Female infertility associated with anovulation: Secondary | ICD-10-CM | POA: Diagnosis not present

## 2019-06-03 LAB — PROLACTIN: Prolactin: 1152 ng/mL — ABNORMAL HIGH (ref 4.8–23.3)

## 2019-06-08 ENCOUNTER — Other Ambulatory Visit: Payer: Self-pay

## 2019-06-08 ENCOUNTER — Encounter: Payer: Self-pay | Admitting: Obstetrics & Gynecology

## 2019-06-08 ENCOUNTER — Ambulatory Visit (INDEPENDENT_AMBULATORY_CARE_PROVIDER_SITE_OTHER): Payer: BLUE CROSS/BLUE SHIELD

## 2019-06-08 ENCOUNTER — Other Ambulatory Visit: Payer: Self-pay | Admitting: Obstetrics & Gynecology

## 2019-06-08 ENCOUNTER — Ambulatory Visit (INDEPENDENT_AMBULATORY_CARE_PROVIDER_SITE_OTHER): Payer: BLUE CROSS/BLUE SHIELD | Admitting: Obstetrics & Gynecology

## 2019-06-08 VITALS — BP 120/80 | Ht 66.0 in | Wt 277.0 lb

## 2019-06-08 DIAGNOSIS — N970 Female infertility associated with anovulation: Secondary | ICD-10-CM

## 2019-06-08 DIAGNOSIS — E221 Hyperprolactinemia: Secondary | ICD-10-CM

## 2019-06-08 NOTE — Progress Notes (Signed)
  HPI: Pt is a 34 yo G1P0010 AA F with reg cycles and desire for pregnancy, denies galactorrhea, headaches, changes in vision, breats T, and has mild dysmenorrhea.  Recent labs for infertility checked, with elevated Prolactin >1000 on 2 occasions (fasting).  Other labs normal.  [place] today. HSG tomorrow. SA pending.   Ultrasound demonstrates no masses seen, normal exam (see below)  PMHx: She  has a past medical history of Cervical polyp (03/28/2016). Also,  has no past surgical history on file., family history is not on file.,  reports that she has never smoked. She has never used smokeless tobacco. She reports that she does not drink alcohol or use drugs.  She has a current medication list which includes the following prescription(s): fluticasone. Also, has No Known Allergies.  Review of Systems  All other systems reviewed and are negative.   Objective: BP 120/80   Ht 5\' 6"  (1.676 m)   Wt 277 lb (125.6 kg)   LMP 06/02/2019   BMI 44.71 kg/m   Physical examination Constitutional NAD, Conversant  Skin No rashes, lesions or ulceration.   Extremities: Moves all appropriately.  Normal ROM for age. No lymphadenopathy.  Neuro: Grossly intact  Psych: Oriented to PPT.  Normal mood. Normal affect.   06/04/2019 PELVIS TRANSVAGINAL NON-OB (TV ONLY)  Result Date: 06/08/2019 Patient Name: Karen Campbell DOB: 01-23-1986 MRN: [id number] ULTRASOUND REPORT Location: [facility] Date of Service: 06/08/2019 Indications: Infertility Findings: The uterus is retroverted and measures 6.8 x 4.8 x 3.3 cm. Echo texture is homogenous without evidence of focal masses. The Endometrium measures 9.5 mm. Right Ovary measures 2.5 x 2.3 x 1.4 cm. It is normal in appearance. Left Ovary measures 2.5 x 1.9 x 1.3 cm. It is normal in appearance. Survey of the adnexa demonstrates no adnexal masses. There is no free fluid in the cul de sac. Impression: 1. Normal pelvic ultrasound. Recommendations: 1.Clinical correlation with the  patient's History and Physical Exam. 06/10/2019, RT Review of ULTRASOUND.    I have personally reviewed images and report of recent ultrasound done at [facility].    Plan of management to be discussed with patient. [facility], MD, FACOG [facility], [facility] 06/08/2019  8:42 AM   Assessment:  Infertility associated with anovulation  Hyperprolactinemia (HCC) - Plan: Ambulatory referral to Endocrinology  Results of labs discussed, will plan endocrine referral to assess reasons for prolactin elevation.  Discussed potential for pituitary etiologies.  Cont w HSG tomorrow and SA in near future to have ready for further meds or treatments to help, although correcting the prolactin problem may be most beneficial.  A total of 20 minutes were spent face-to-face with the patient as well as preparation, review, communication, and documentation during this encounter.   06/10/2019, MD, Annamarie Major Ob/Gyn, [facility] 06/08/2019  8:52 AM

## 2019-06-08 NOTE — Patient Instructions (Signed)
Prolactin Level Test Why am I having this test? The prolactin level test is often used to diagnose and monitor problems with the pituitary gland, such as pituitary tumors. It may also be used to help find the cause of certain other conditions, such as an abnormal absence of menstrual cycles (amenorrhea) or a thyroid gland that does not produce enough hormones (hypothyroidism). Your health care provider may order this test if you have:  Irregular menstrual periods.  Loss of libido.  Milky fluid coming from your nipples (when not breastfeeding).  Fatigue. What is being tested? This test measures the amount of prolactin in your blood. Prolactin is a hormone that is produced by the pituitary gland. Prolactin levels normally go up and down (fluctuate) due to stress, illness, trauma, or surgery. Increased levels can also be caused by tumors or other health problems. What kind of sample is taken?  A blood sample is required for this test. It is usually collected by inserting a needle into a blood vessel. Tell a health care provider about:  All medicines you are taking, including vitamins, herbs, eye drops, creams, and over-the-counter medicines. How are the results reported? Your test results will be reported as values that indicate the amount of prolactin in your blood. Your health care provider will compare your results to normal ranges that were established after testing a large group of people (reference ranges). Reference ranges may vary among labs and hospitals. For this test, common reference ranges are:  Adult female: 3-13 ng/mL.  Adult female: 3-27 ng/mL.  Pregnant female: 20-400 ng/mL. What do the results mean? Increased levels of prolactin may mean that you have:  A pituitary gland tumor.  Amenorrhea.  Hypothyroidism.  Certain pituitary or reproductive syndromes.  Kidney failure. Decreased levels of prolactin may indicate:  Lack of blood to the pituitary  gland.  Pituitary gland failure. Talk with your health care provider about what your results mean. Questions to ask your health care provider Ask your health care provider, or the department that is doing the test:  When will my results be ready?  How will I get my results?  What are my treatment options?  What other tests do I need?  What are my next steps? Summary  The prolactin level test is often used to diagnose and monitor problems with the pituitary gland, such as pituitary tumors. It may also be used to help find the cause of certain other conditions, such as amenorrhea or hypothyroidism.  This test measures the amount of prolactin in your blood. Prolactin is a hormone that is produced by the pituitary gland.  Prolactin levels normally go up and down (fluctuate) due to stress, illness, trauma, or surgery. Increased levels can also be caused by tumors or other health problems.  Talk with your health care provider about what your results mean. This information is not intended to replace advice given to you by your health care provider. Make sure you discuss any questions you have with your health care provider. Document Revised: 10/01/2016 Document Reviewed: 10/01/2016 Elsevier Patient Education  2020 [employer].

## 2019-06-09 ENCOUNTER — Other Ambulatory Visit: Payer: Self-pay | Admitting: Obstetrics & Gynecology

## 2019-06-09 ENCOUNTER — Other Ambulatory Visit: Payer: Self-pay

## 2019-06-09 ENCOUNTER — Ambulatory Visit
Admission: RE | Admit: 2019-06-09 | Discharge: 2019-06-09 | Disposition: A | Discharge status: Home / Self Care | Payer: BLUE CROSS/BLUE SHIELD | Source: Ambulatory Visit | Attending: Obstetrics & Gynecology | Admitting: Obstetrics & Gynecology

## 2019-06-09 DIAGNOSIS — N970 Female infertility associated with anovulation: Secondary | ICD-10-CM | POA: Insufficient documentation

## 2019-06-09 DIAGNOSIS — N979 Female infertility, unspecified: Secondary | ICD-10-CM | POA: Diagnosis not present

## 2019-06-09 MED ORDER — IOHEXOL 300 MG/ML  SOLN
25.0000 mL | Freq: Once | INTRAMUSCULAR | Status: AC | PRN
  Administered 2019-06-09: 15 mL

## 2019-06-09 NOTE — Procedures (Signed)
Prep and drape done.  Cervix normal.  Tenaculum placed. Cervix dilated minimally.  Catheter placed into uterine cavity. Approximately 20 mL dye injected under fluoroscopic visualization.  Patent bilateral fallopian tube seen.  Catheter and tenaculum removed.  Pt stable, tolerated procedure well.  Annamarie Major, MD, [facility], [facility] 06/09/2019  2:08 PM

## 2019-06-16 DIAGNOSIS — E221 Hyperprolactinemia: Secondary | ICD-10-CM | POA: Diagnosis not present

## 2019-06-16 DIAGNOSIS — D497 Neoplasm of unspecified behavior of endocrine glands and other parts of nervous system: Secondary | ICD-10-CM | POA: Diagnosis not present

## 2019-06-17 ENCOUNTER — Other Ambulatory Visit: Payer: Self-pay | Admitting: "Endocrinology

## 2019-06-17 DIAGNOSIS — E221 Hyperprolactinemia: Secondary | ICD-10-CM

## 2019-07-02 ENCOUNTER — Ambulatory Visit: Payer: BLUE CROSS/BLUE SHIELD

## 2019-07-27 DIAGNOSIS — L040 Acute lymphadenitis of face, head and neck: Secondary | ICD-10-CM | POA: Diagnosis not present

## 2019-07-27 DIAGNOSIS — L299 Pruritus, unspecified: Secondary | ICD-10-CM | POA: Diagnosis not present

## 2019-07-28 ENCOUNTER — Ambulatory Visit
Admission: RE | Admit: 2019-07-28 | Discharge: 2019-07-28 | Disposition: A | Discharge status: Home / Self Care | Payer: BLUE CROSS/BLUE SHIELD | Source: Ambulatory Visit | Attending: "Endocrinology | Admitting: "Endocrinology

## 2019-07-28 ENCOUNTER — Other Ambulatory Visit: Payer: Self-pay

## 2019-07-28 DIAGNOSIS — E221 Hyperprolactinemia: Secondary | ICD-10-CM

## 2019-07-28 MED ORDER — GADOBUTROL 1 MMOL/ML IV SOLN: 10.0000 mL | Freq: Once | INTRAVENOUS | Status: DC | PRN

## 2019-07-28 MED ORDER — GADOBUTROL 1 MMOL/ML IV SOLN
7.5000 mL | Freq: Once | INTRAVENOUS | Status: AC | PRN
  Administered 2019-07-28: 7.5 mL via INTRAVENOUS

## 2019-10-14 DIAGNOSIS — D497 Neoplasm of unspecified behavior of endocrine glands and other parts of nervous system: Secondary | ICD-10-CM | POA: Diagnosis not present

## 2019-10-14 DIAGNOSIS — E221 Hyperprolactinemia: Secondary | ICD-10-CM | POA: Diagnosis not present

## 2019-11-16 DIAGNOSIS — E221 Hyperprolactinemia: Secondary | ICD-10-CM | POA: Diagnosis not present

## 2019-12-10 DIAGNOSIS — Z23 Encounter for immunization: Secondary | ICD-10-CM | POA: Diagnosis not present

## 2020-02-09 DIAGNOSIS — Z20822 Contact with and (suspected) exposure to covid-19: Secondary | ICD-10-CM | POA: Diagnosis not present

## 2020-02-09 DIAGNOSIS — Z03818 Encounter for observation for suspected exposure to other biological agents ruled out: Secondary | ICD-10-CM | POA: Diagnosis not present

## 2020-02-09 DIAGNOSIS — U071 COVID-19: Secondary | ICD-10-CM | POA: Diagnosis not present

## 2020-06-17 DIAGNOSIS — D171 Benign lipomatous neoplasm of skin and subcutaneous tissue of trunk: Secondary | ICD-10-CM | POA: Diagnosis not present

## 2020-06-17 DIAGNOSIS — L728 Other follicular cysts of the skin and subcutaneous tissue: Secondary | ICD-10-CM | POA: Diagnosis not present

## 2020-07-21 DIAGNOSIS — D171 Benign lipomatous neoplasm of skin and subcutaneous tissue of trunk: Secondary | ICD-10-CM | POA: Diagnosis not present

## 2020-08-11 DIAGNOSIS — D171 Benign lipomatous neoplasm of skin and subcutaneous tissue of trunk: Secondary | ICD-10-CM | POA: Diagnosis not present

## 2020-08-15 LAB — SURGICAL PATHOLOGY

## 2021-02-24 ENCOUNTER — Ambulatory Visit: Payer: BLUE CROSS/BLUE SHIELD | Admitting: Obstetrics & Gynecology

## 2021-05-22 ENCOUNTER — Ambulatory Visit (INDEPENDENT_AMBULATORY_CARE_PROVIDER_SITE_OTHER): Payer: BC Managed Care – PPO | Admitting: Obstetrics & Gynecology

## 2021-05-22 ENCOUNTER — Other Ambulatory Visit (HOSPITAL_COMMUNITY)
Admission: RE | Admit: 2021-05-22 | Discharge: 2021-05-22 | Disposition: A | Discharge status: Home / Self Care | Payer: BC Managed Care – PPO | Source: Ambulatory Visit | Attending: Obstetrics & Gynecology | Admitting: Obstetrics & Gynecology

## 2021-05-22 ENCOUNTER — Encounter: Payer: Self-pay | Admitting: Obstetrics & Gynecology

## 2021-05-22 VITALS — BP 120/80 | Ht 66.0 in | Wt 278.0 lb

## 2021-05-22 DIAGNOSIS — Z01419 Encounter for gynecological examination (general) (routine) without abnormal findings: Secondary | ICD-10-CM

## 2021-05-22 DIAGNOSIS — Z124 Encounter for screening for malignant neoplasm of cervix: Secondary | ICD-10-CM | POA: Insufficient documentation

## 2021-05-22 NOTE — Progress Notes (Signed)
? ?HPI: ?     [name] is a 37 y.o. G1P0010 who LMP was Patient's last menstrual period was 04/26/2021., she presents today for her annual examination. The patient has no complaints today. She has been off of birth control and is trying for pregnancy, consistently for the last 6 months.  Regular cycles; has positive ovulation predictor test result. ? ?The patient is sexually active. Her last pap: approximate date 2020 and was normal. The patient does perform self breast exams.  There is no notable family history of breast or ovarian cancer in her family.  The patient has regular exercise: yes.  The patient denies current symptoms of depression.   ? ?GYN History: ?Contraception: none ? ?PMHx: ?Past Medical History:  ?Diagnosis Date  ? Cervical polyp 03/28/2016  ? ?History reviewed. No pertinent surgical history. ?History reviewed. No pertinent family history. ?Social History  ? ?Tobacco Use  ? Smoking status: Never  ? Smokeless tobacco: Never  ?Vaping Use  ? Vaping Use: Never used  ?Substance Use Topics  ? Alcohol use: No  ? Drug use: No  ? ? ?Current Outpatient Medications:  ?  fluticasone (FLONASE) 50 MCG/ACT nasal spray, Place 2 sprays into both nostrils daily., Disp: 16 g, Rfl: 0 ?Allergies: Patient has no known allergies. ? ?Review of Systems  ?Constitutional:  Negative for chills, fever and malaise/fatigue.  ?HENT:  Negative for congestion, sinus pain and sore throat.   ?Eyes:  Negative for blurred vision and pain.  ?Respiratory:  Negative for cough and wheezing.   ?Cardiovascular:  Negative for chest pain and leg swelling.  ?Gastrointestinal:  Negative for abdominal pain, constipation, diarrhea, heartburn, nausea and vomiting.  ?Genitourinary:  Negative for dysuria, frequency, hematuria and urgency.  ?Musculoskeletal:  Negative for back pain, joint pain, myalgias and neck pain.  ?Skin:  Negative for itching and rash.  ?Neurological:  Negative for dizziness, tremors and weakness.   ?Endo/Heme/Allergies:  Does not bruise/bleed easily.  ?Psychiatric/Behavioral:  Negative for depression. The patient is not nervous/anxious and does not have insomnia.   ? ?Objective: ?BP 120/80   Ht 5\' 6"  (1.676 m)   Wt 278 lb (126.1 kg)   LMP 04/26/2021   BMI 44.87 kg/m?   ?Filed Weights  ? 05/22/21 0827  ?Weight: 278 lb (126.1 kg)  ? Body mass index is 44.87 kg/m?07/22/21 ?Physical Exam ?Constitutional:   ?   General: She is not in acute distress. ?   Appearance: She is well-developed. She is obese.  ?Genitourinary:  ?   Bladder, rectum and urethral meatus normal.  ?   No lesions in the vagina.  ?   Right Labia: No rash, tenderness or lesions. ?   Left Labia: No tenderness, lesions or rash. ?   No vaginal bleeding.  ? ?   Right Adnexa: not tender and no mass present. ?   Left Adnexa: not tender and no mass present. ?   No cervical motion tenderness, friability, lesion or polyp.  ?   Uterus is not enlarged.  ?   No uterine mass detected. ?   Pelvic exam was performed with patient in the lithotomy position.  ?Breasts: ?   Right: No mass, skin change or tenderness.  ?   Left: No mass, skin change or tenderness.  ?HENT:  ?   Head: Normocephalic and atraumatic. No laceration.  ?   Right Ear: Hearing normal.  ?   Left Ear: Hearing normal.  ?   Mouth/Throat:  ?  Pharynx: Uvula midline.  ?Eyes:  ?   Pupils: Pupils are equal, round, and reactive to light.  ?Neck:  ?   Thyroid: No thyromegaly.  ?Cardiovascular:  ?   Rate and Rhythm: Normal rate and regular rhythm.  ?   Heart sounds: No murmur heard. ?  No friction rub. No gallop.  ?Pulmonary:  ?   Effort: Pulmonary effort is normal. No respiratory distress.  ?   Breath sounds: Normal breath sounds. No wheezing.  ?Abdominal:  ?   General: Bowel sounds are normal. There is no distension.  ?   Palpations: Abdomen is soft.  ?   Tenderness: There is no abdominal tenderness. There is no rebound.  ?Musculoskeletal:     ?   General: Normal range of motion.  ?   Cervical back:  Normal range of motion and neck supple.  ?Neurological:  ?   Mental Status: She is alert and oriented to person, place, and time.  ?   Cranial Nerves: No cranial nerve deficit.  ?Skin: ?   General: Skin is warm and dry.  ?Psychiatric:     ?   Judgment: Judgment normal.  ?Vitals reviewed.  ? ? ?Assessment:  ANNUAL EXAM ?1. Women's annual routine gynecological examination   ?2. Screening for cervical cancer   ? ? ? ?Screening Plan: ?           ?1.  Cervical Screening-  Pap smear done today ? ?2. Breast screening- Exam annually and mammogram>40 planned  ? ?3. Colonoscopy every 10 years, Hemoccult testing - after age 30 ? ?4. Labs managed by PCP ? ?5. Counseling for contraception: no method ? [facility] - 05/22/21 0829   ? ?  ? Pregnancy Intention Screening  ? Does the patient want to become pregnant in the next year? Yes   ? Does the patient's partner want to become pregnant in the next year? Yes   ? Would the patient like to discuss contraceptive options today? No   ?  ? Contraception Wrap Up  ? Current Method No Method - Other Reason   ? End Method No Method - Other Reason   ? Contraception Counseling Provided No   ? ?  ?  ? ?  ? ?The pregnancy intention screening data noted above was reviewed. Potential methods of contraception were discussed. The patient elected to proceed with Pregnant/Seeking Pregnancy.  ? ? ? ?  F/U ? Return in about 1 year (around 05/23/2022) for Annual. ? ?Karen Major, MD, FACOG ?[facility], [facility] ?05/22/2021  8:57 AM ? ? ?

## 2021-05-24 ENCOUNTER — Other Ambulatory Visit: Payer: Self-pay | Admitting: Obstetrics & Gynecology

## 2021-05-24 LAB — CYTOLOGY - PAP
Adequacy: ABSENT
Comment: NEGATIVE
Diagnosis: NEGATIVE
High risk HPV: NEGATIVE

## 2021-05-24 MED ORDER — METRONIDAZOLE 0.75 % VA GEL: 1.0000 | Freq: Every day | VAGINAL | 0 refills | Status: AC

## 2022-02-13 DIAGNOSIS — H6501 Acute serous otitis media, right ear: Secondary | ICD-10-CM | POA: Diagnosis not present

## 2022-02-13 DIAGNOSIS — J069 Acute upper respiratory infection, unspecified: Secondary | ICD-10-CM | POA: Diagnosis not present

## 2022-03-29 DIAGNOSIS — H00011 Hordeolum externum right upper eyelid: Secondary | ICD-10-CM | POA: Diagnosis not present

## 2022-03-29 DIAGNOSIS — H02889 Meibomian gland dysfunction of unspecified eye, unspecified eyelid: Secondary | ICD-10-CM | POA: Diagnosis not present

## 2022-10-16 DIAGNOSIS — J069 Acute upper respiratory infection, unspecified: Secondary | ICD-10-CM | POA: Diagnosis not present

## 2022-10-24 DIAGNOSIS — E221 Hyperprolactinemia: Secondary | ICD-10-CM | POA: Diagnosis not present

## 2022-10-24 DIAGNOSIS — N6452 Nipple discharge: Secondary | ICD-10-CM | POA: Diagnosis not present

## 2022-10-24 DIAGNOSIS — N979 Female infertility, unspecified: Secondary | ICD-10-CM | POA: Diagnosis not present

## 2022-10-24 DIAGNOSIS — N921 Excessive and frequent menstruation with irregular cycle: Secondary | ICD-10-CM | POA: Diagnosis not present

## 2022-10-25 DIAGNOSIS — Z13220 Encounter for screening for lipoid disorders: Secondary | ICD-10-CM | POA: Diagnosis not present

## 2022-10-25 DIAGNOSIS — Z131 Encounter for screening for diabetes mellitus: Secondary | ICD-10-CM | POA: Diagnosis not present

## 2022-10-25 DIAGNOSIS — E559 Vitamin D deficiency, unspecified: Secondary | ICD-10-CM | POA: Diagnosis not present

## 2022-10-25 DIAGNOSIS — Z13228 Encounter for screening for other metabolic disorders: Secondary | ICD-10-CM | POA: Diagnosis not present

## 2022-10-25 DIAGNOSIS — Z1321 Encounter for screening for nutritional disorder: Secondary | ICD-10-CM | POA: Diagnosis not present

## 2022-11-27 ENCOUNTER — Ambulatory Visit: Payer: BC Managed Care – PPO | Admitting: Nurse Practitioner

## 2023-07-24 ENCOUNTER — Ambulatory Visit: Admitting: Family Medicine

## 2023-07-24 VITALS — BP 111/76 | HR 58 | Temp 98.1°F | Ht 66.0 in | Wt 306.0 lb

## 2023-07-24 DIAGNOSIS — Z7689 Persons encountering health services in other specified circumstances: Secondary | ICD-10-CM

## 2023-07-24 DIAGNOSIS — R7303 Prediabetes: Secondary | ICD-10-CM

## 2023-07-24 DIAGNOSIS — E66813 Obesity, class 3: Secondary | ICD-10-CM

## 2023-07-24 DIAGNOSIS — D352 Benign neoplasm of pituitary gland: Secondary | ICD-10-CM | POA: Diagnosis not present

## 2023-07-24 DIAGNOSIS — Z6841 Body Mass Index (BMI) 40.0 and over, adult: Secondary | ICD-10-CM

## 2023-07-24 LAB — POCT GLYCOSYLATED HEMOGLOBIN (HGB A1C): Hemoglobin A1C: 6.2 % — AB (ref 4.0–5.6)

## 2023-07-24 MED ORDER — CABERGOLINE 0.5 MG PO TABS: 0.5000 mg | ORAL_TABLET | ORAL | 2 refills | Status: AC

## 2023-07-24 MED ORDER — SEMAGLUTIDE-WEIGHT MANAGEMENT 0.25 MG/0.5ML ~~LOC~~ SOAJ: 0.2500 mg | SUBCUTANEOUS | 0 refills | Status: AC

## 2023-07-24 NOTE — Progress Notes (Signed)
 New Patient Office Visit  Subjective   Patient ID: Karen Campbell, female    DOB: 09-23-1985  Age: 38 y.o. MRN: [id number]  CC:  Chief Complaint  Patient presents with   Establish Care   Weight Management Screening   pre-diabetic    HPI [name] is a 38 year old female who presents to establish with [facility] at [facility].   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 Plans to start walking daily   Outpatient Encounter Medications as of 07/24/2023  Medication Sig   Semaglutide-Weight Management 0.25 MG/0.5ML SOAJ Inject 0.25 mg into the skin once a week.   [DISCONTINUED] cabergoline (DOSTINEX) 0.5 MG tablet Take 0.5 mg by mouth 2 (two) times a week.   [START ON 10/01/2023] cabergoline (DOSTINEX) 0.5 MG tablet Take 1 tablet (0.5 mg total) by mouth 2 (two) times a week.   [DISCONTINUED] CABERGOLINE PO    [DISCONTINUED] fluticasone  (FLONASE ) 50 MCG/ACT nasal spray Place 2 sprays into both nostrils daily.   No facility-administered encounter medications on file as of 07/24/2023.    Patient Active Problem List   Diagnosis Date Noted   Infertility associated with anovulation    Hyperprolactinemia (HCC) 06/08/2019   Cervical polyp 06/26/2016   Biliary colic 05/14/2016   Past Medical History:  Diagnosis Date   Cervical polyp 03/28/2016   GERD (gastroesophageal reflux disease)    Past Surgical History:  Procedure Laterality Date   CHOLECYSTECTOMY  2017   Family History  Problem Relation Age of Onset   Diabetes Father    Obesity Father    Diabetes Maternal Grandfather    Diabetes Paternal Grandmother    Diabetes Maternal Grandmother    Obesity Paternal Grandfather     Obesity Paternal Aunt    Social History   Socioeconomic History   Marital status: Single    Spouse name: Not on file   Number of children: Not on file   Years of education: Not on file   Highest education level: Associate degree: academic program  Occupational History   Not on file  Tobacco Use   Smoking status: Never   Smokeless tobacco: Never  Vaping Use   Vaping status: Never Used  Substance and Sexual Activity   Alcohol use: No   Drug use: No   Sexual activity: Yes    Birth control/protection: None  Other Topics Concern   Not on file  Social History Narrative   Not on file   Social Drivers of Health   Financial Resource Strain: Low Risk  (07/24/2023)   Overall Financial Resource Strain (CARDIA)    Difficulty of Paying Living Expenses: Not hard at all  Food Insecurity: No Food Insecurity (07/24/2023)   Hunger Vital Sign    Worried About Running Out of Food in the Last Year: Never true    Ran Out of Food in the Last Year: Never true  Transportation Needs: No Transportation Needs (07/24/2023)   PRAPARE - Administrator, Civil Service (Medical): No    Lack of Transportation (Non-Medical): No  Physical Activity: Unknown (07/24/2023)   Exercise Vital Sign    Days of Exercise per Week: 0 days    Minutes of Exercise per Session: Not  on file  Stress: No Stress Concern Present (07/24/2023)   [employer] of Occupational Health - Occupational Stress Questionnaire    Feeling of Stress : Not at all  Social Connections: Moderately Integrated (07/24/2023)   Social Connection and Isolation Panel [NHANES]    Frequency of Communication with Friends and Family: More than three times a week    Frequency of Social Gatherings with Friends and Family: Once a week    Attends Religious Services: 1 to 4 times per year    Active Member of [employer] or Organizations: No    Attends Engineer, structural: Not on file    Marital Status: Living with partner  Intimate Partner  Violence: Not on file   Outpatient Medications Prior to Visit  Medication Sig Dispense Refill   cabergoline (DOSTINEX) 0.5 MG tablet Take 0.5 mg by mouth 2 (two) times a week.     CABERGOLINE PO      fluticasone  (FLONASE ) 50 MCG/ACT nasal spray Place 2 sprays into both nostrils daily. 16 g 0   No facility-administered medications prior to visit.   No Known Allergies  ROS: see HPI     Objective  Today's Vitals   07/24/23 0857  BP: 111/76  Pulse: (!) 58  Temp: 98.1 F (36.7 C)  TempSrc: Oral  Weight: (!) 306 lb (138.8 kg)  Height: 5\' 6"  (1.676 m)   GENERAL: Well-appearing, in NAD. Well nourished.  SKIN: Pink, warm and dry. No rash, lesion, ulceration, or ecchymoses.  Head: Normocephalic. NECK: Trachea midline. Full ROM w/o pain or tenderness. No lymphadenopathy.  EARS: Tympanic membranes are intact, translucent without bulging and without drainage. Appropriate landmarks visualized.  EYES: Conjunctiva clear without exudates. EOMI, PERRL, no drainage present.  NOSE: Septum midline w/o deformity. Nares patent, mucosa pink and non-inflamed w/o drainage. No sinus tenderness.  THROAT: Uvula midline. Oropharynx clear. Tonsils non-inflamed without exudate. Mucous membranes pink and moist.  RESPIRATORY: Chest wall symmetrical. Respirations even and non-labored. Breath sounds clear to auscultation bilaterally.  CARDIAC: S1, S2 present, regular rate and rhythm without murmur or gallops. Peripheral pulses 2+ bilaterally.  MSK: Muscle tone and strength appropriate for age. Joints w/o tenderness, redness, or swelling.  EXTREMITIES: Without clubbing, cyanosis, or edema.  NEUROLOGIC: No motor or sensory deficits. Steady, even gait. C2-C12 intact.  PSYCH/MENTAL STATUS: Alert, oriented x 3. Cooperative, appropriate mood and affect.    Assessment & Plan:   1. Encounter to establish care (Primary) Patient is a 26- year-old female who presents today to establish care with primary care at  [facility]. Reviewed the past medical history, family history, social history, surgical history, medications and allergies today- updates made as indicated. Patient has concerns today about weight loss management.   2. Class 3 severe obesity due to excess calories without serious comorbidity with body mass index (BMI) of 45.0 to 49.9 in adult Recent A1c of 6.0% completed in 10/2022. Discussed options, with shared decision making, patient would like to start GLP-1 receptor agonist as a once weekly injection for assistance with weight management. Prescription for [facility] 2.5mg  sent to pharmacy. Will start at lowest dose, 2.5mg  once weekly. Discussed potential risks and side effects. Educated patient regarding medication side effects, rotating injection site, demonstrated how to utilize injectable pen, and advised patient to inform us  if he presents with any questions or concerns. Discussed that if he is tolerating this well, we can look to gradually increase the dose as soon as 4 weeks.   - Semaglutide-Weight Management 0.25 MG/0.5ML  SOAJ; Inject 0.25 mg into the skin once a week.  Dispense: 2 mL; Refill: 0 - POCT HgB A1C  3. 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 Last MRI completed in 07/2019. No recommendations from radiologist on frequency of imaging. Will update MRI and refer patient to endocrinology for further evaluation and management.  - MR Brain W Wo Contrast; Future - Ambulatory referral to Endocrinology - cabergoline (DOSTINEX) 0.5 MG tablet; Take 1 tablet (0.5 mg total) by mouth 2 (two) times a week.  Dispense: 10 tablet; Refill: 2  4. Pre-diabetes Last hemoglobin A1c last checked in September 2024 with results of 6.0%. Will repeat hemoglobin A1c today. Today A1c 6.2%, still in the pre-diabetic range. Discussed lifestyle modifications including healthy diet and daily exercise.  - POCT HgB A1C   Return in about 4 weeks (around 08/21/2023) for weight loss  management .   Wilhelmena Hanson, FNP

## 2023-07-24 NOTE — Patient Instructions (Signed)

## 2023-07-26 ENCOUNTER — Ambulatory Visit: Admitting: Family Medicine

## 2023-07-29 ENCOUNTER — Encounter: Payer: Self-pay | Admitting: Family Medicine

## 2023-07-29 ENCOUNTER — Telehealth: Payer: Self-pay

## 2023-07-29 NOTE — Telephone Encounter (Signed)
(  KeyRobyn Christ)  Rx #: [id number]  Wegovy  0.25MG /0.5ML auto-injectors  Form True Rx General Prior Authorization Request Form   Plan Contact [phone] phone [phone] fax

## 2023-08-08 ENCOUNTER — Other Ambulatory Visit: Payer: Self-pay | Admitting: Family Medicine

## 2023-08-16 ENCOUNTER — Ambulatory Visit: Admitting: Podiatry

## 2023-08-21 ENCOUNTER — Ambulatory Visit: Admitting: Family Medicine

## 2023-11-28 ENCOUNTER — Ambulatory Visit

## 2024-02-24 ENCOUNTER — Encounter: Payer: Self-pay | Admitting: Family Medicine
